# Patient Record
Sex: Male | Born: 1985 | Race: White | Hispanic: No | Marital: Married | Smoking: Former smoker
Health system: Southern US, Community
[De-identification: ages and names within clinical notes are randomized; demographics above are authoritative.]

## PROBLEM LIST (undated history)

## (undated) DIAGNOSIS — F329 Major depressive disorder, single episode, unspecified: Secondary | ICD-10-CM

## (undated) DIAGNOSIS — E785 Hyperlipidemia, unspecified: Secondary | ICD-10-CM

## (undated) DIAGNOSIS — F32A Depression, unspecified: Secondary | ICD-10-CM

## (undated) DIAGNOSIS — F419 Anxiety disorder, unspecified: Secondary | ICD-10-CM

## (undated) HISTORY — PX: KNEE SURGERY: SHX244

## (undated) HISTORY — PX: WISDOM TOOTH EXTRACTION: SHX21

## (undated) HISTORY — DX: Hyperlipidemia, unspecified: E78.5

## (undated) HISTORY — DX: Anxiety disorder, unspecified: F41.9

## (undated) HISTORY — DX: Depression, unspecified: F32.A

---
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
## 2010-01-21 ENCOUNTER — Emergency Department (HOSPITAL_COMMUNITY): Admission: EM | Admit: 2010-01-21 | Discharge: 2010-01-21 | Payer: Self-pay | Admitting: Emergency Medicine

## 2010-03-11 ENCOUNTER — Emergency Department (HOSPITAL_COMMUNITY): Admission: EM | Admit: 2010-03-11 | Discharge: 2010-03-11 | Payer: Self-pay | Admitting: Emergency Medicine

## 2010-08-01 LAB — RAPID STREP SCREEN (MED CTR MEBANE ONLY): Streptococcus, Group A Screen (Direct): NEGATIVE

## 2010-10-05 NOTE — Op Note (Signed)
NAME:  NORRIN, SHREFFLER                           ACCOUNT NO.:  [id number]   MEDICAL RECORD NO.:  [id number]                   PATIENT TYPE:  AMB   LOCATION:  DSC                                  FACILITY:  [facility]   PHYSICIAN:  Harvie Junior, M.D.                DATE OF BIRTH:  [birth year]   DATE OF PROCEDURE:  11/23/2003  DATE OF DISCHARGE:                                 OPERATIVE REPORT   PREOPERATIVE DIAGNOSES:  1. Anterior cruciate ligament tear.  2. Medial meniscal tear.  3. Lateral meniscal tear.   POSTOPERATIVE DIAGNOSES:  1. Anterior cruciate ligament tear.  2. Medial meniscal tear.  3. Lateral meniscal tear.   PROCEDURES:  1. Anterior cruciate ligament reconstruction with central one-third patella     tendon.  2. Repair of medial meniscus.  3. Partial debridement of lateral meniscal tear.   SURGEON:  Harvie Junior, M.D.   ASSISTANT:  Marshia Ly, P.A.   ANESTHESIA:  General.   BRIEF HISTORY:  A 25 year old male with a long history of having a twisting  injury to his knee; ultimately suffered ACL with medial and lateral meniscal  tears.  An MRI was obtained by Dr. Althea Charon, showing the pathology.  We were  consulted for treatment.  It showed the ACL tear and we talked about  treatment options and ultimately felt that fixation was most appropriate.  He is brought to the operating room for this procedure.   DESCRIPTION OF PROCEDURE:  The patient was brought to the operating room and  after adequate anesthesia was obtained with general endotracheal, the  patient was placed prone on the operating room table.  The left leg was  prepped and draped in the usual sterile fashion.  Following this, routine  arthroscopic examination of the knee revealed there was obvious anterior  cruciate ligament tear.  Examination under anesthesia had shown a positive  pivot shift, positive Lachman's'.   The attention was then turned to the medial side, where there was noted to  be an  undersurface medial meniscal tear.  There was a small area where it  came through on the superior surface and measured about 2 cm in length.  It  was felt at this time that the most appropriate course of action here was  going to be fixation.  They did not feel that suture fixation was warranted,  given that the displacement was not completely anterior to the condyle.  At  this time meniscal screws were chosen as a fixation option.  The meniscal  rasps were used to rasp the undersurface of the tear, as well as the small  less than 1 cm opening superiorly.  Four meniscal screws were placed in  succession, from the joint of the posterior third and anterior two-thirds  around to the posterior horn.  Excellent stability and fixation of the  meniscus was achieved.  The meniscus would  not translate forward at this  point.   Attention at this time turned to the lateral side, where actually this  procedure was performed before the medial meniscal repair; but, there was a  displaceable lateral meniscal tear.  This was debrided.  There was a complex  portion, but it was stable both above and below.  Given that we were going  to stabilize the knee it was felt that this was best left alone.  The  debridement of the anterior portion of this was undertaken from the lateral  portal.   Attention at this time was turned back to the medial side for the medial  meniscal repair (which had been outlined before) was performed.  Attention  at this time was started at the notch, where the notch was opened with a  combination of a suction shaver and motorized bur.  Excellent notchplasty  was performed.  Following this, the leg was exsanguinated and bulb pressure  tourniquet was inflated to 300 mmHg.  Following this a midline incision was  made from the patella distally.  Subcutaneous tissue was lifted out to the  level of the patella tendon.  Then 11 mm of central third patella tendon was  harvested, with bone  blocks from the patella as well as from the distal  tibia.  Following this, this was taken to the back table where it was  fashioned by Gus Puma graftologist.   Following this, attention was turned back to the knee, where the Arthrex  guide was used at a 55.  The footprint of the old ACL was used 7 mm anterior  to the posterior cruciate ligament.  A guide wire was placed into the  central portion here.  This was over-reamed 10.  The back wall was rasped.  A 6.5 mm over-the-top guide was then used, and the femoral hole was then  drilled with a footprint initially, to make sure there was a good back wall.  Seeing this, it was drilled to a level of 22, as that was the length of the  bone plug.  The overall tunnel length at this time was 87; the bone-tendon-  bone plug was 86 mm.   At this time, the notch was made for the femoral screw and all excess bone  and bone fragments were removed at this point.  The graft was then advanced  into the knee, locked in place on the femoral side with an 8 x 20 screw.  The knee was then cycled 20 times; no tendency towards an asymmetry.  The  tibial side was then locked in place with a 7 x 20 mm interference fit screw  under direct vision.   At this point, the camera was put back in place. The new ACL was watched.  Easy full extension was achieved and excellent stability throughout the  range of motion.  Medially and laterally were checked again.  No excess bone  fragments.  Excellent stability of the knee was achieved.  Negative  Lachman's, negative pivot shift.   The patella tendon was closed with three interrupted Ethibond sutures.  The  peritenon was closed with an 0 Vicryl running suture.  The subcutaneous was  closed with 2-0 Vicryl, and the skin with a 3-0 Maxon pullout suture.  A  sterile compressive dressing was applied at this point.  The portals were  closed with a bandage.  A knee immobilizer was placed, as well as a TED stocking.  The  patient was then  taken to the recovery room and noted to be  in satisfactory condition.   ESTIMATED BLOOD LOSS:  None.   TOTAL TOURNIQUET TIME:  35 min.                                               Harvie Junior, M.D.    Ranae Plumber  D:  11/23/2003  T:  11/24/2003  Job:  [id number]

## 2010-11-06 ENCOUNTER — Emergency Department (HOSPITAL_BASED_OUTPATIENT_CLINIC_OR_DEPARTMENT_OTHER)
Admission: EM | Admit: 2010-11-06 | Discharge: 2010-11-06 | Disposition: A | Discharge status: Home / Self Care | Payer: Self-pay | Attending: Emergency Medicine | Admitting: Emergency Medicine

## 2010-11-06 DIAGNOSIS — F341 Dysthymic disorder: Secondary | ICD-10-CM | POA: Insufficient documentation

## 2010-11-06 DIAGNOSIS — R197 Diarrhea, unspecified: Secondary | ICD-10-CM | POA: Insufficient documentation

## 2010-11-06 DIAGNOSIS — J029 Acute pharyngitis, unspecified: Secondary | ICD-10-CM | POA: Insufficient documentation

## 2010-12-29 ENCOUNTER — Encounter: Payer: Self-pay | Admitting: Emergency Medicine

## 2010-12-29 ENCOUNTER — Emergency Department (HOSPITAL_BASED_OUTPATIENT_CLINIC_OR_DEPARTMENT_OTHER)
Admission: EM | Admit: 2010-12-29 | Discharge: 2010-12-29 | Disposition: A | Discharge status: Home / Self Care | Payer: Self-pay | Attending: Emergency Medicine | Admitting: Emergency Medicine

## 2010-12-29 DIAGNOSIS — R112 Nausea with vomiting, unspecified: Secondary | ICD-10-CM | POA: Insufficient documentation

## 2010-12-29 DIAGNOSIS — N390 Urinary tract infection, site not specified: Secondary | ICD-10-CM | POA: Insufficient documentation

## 2010-12-29 DIAGNOSIS — J020 Streptococcal pharyngitis: Secondary | ICD-10-CM | POA: Insufficient documentation

## 2010-12-29 DIAGNOSIS — N509 Disorder of male genital organs, unspecified: Secondary | ICD-10-CM | POA: Insufficient documentation

## 2010-12-29 DIAGNOSIS — R509 Fever, unspecified: Secondary | ICD-10-CM | POA: Insufficient documentation

## 2010-12-29 LAB — URINE MICROSCOPIC-ADD ON

## 2010-12-29 LAB — URINALYSIS, ROUTINE W REFLEX MICROSCOPIC
Bilirubin Urine: NEGATIVE
Hgb urine dipstick: NEGATIVE
Ketones, ur: NEGATIVE mg/dL
Protein, ur: 100 mg/dL — AB
Urobilinogen, UA: 0.2 mg/dL (ref 0.0–1.0)

## 2010-12-29 MED ORDER — HYDROCODONE-ACETAMINOPHEN 5-325 MG PO TABS: 2.0000 | ORAL_TABLET | ORAL | Status: AC | PRN

## 2010-12-29 MED ORDER — HYDROMORPHONE HCL 2 MG/ML IJ SOLN
INTRAMUSCULAR | Status: AC
  Administered 2010-12-29: 2 mg via INTRAMUSCULAR
  Filled 2010-12-29: qty 1

## 2010-12-29 MED ORDER — AZITHROMYCIN 1 G PO PACK
PACK | ORAL | Status: AC
  Filled 2010-12-29: qty 1

## 2010-12-29 MED ORDER — CIPROFLOXACIN HCL 500 MG PO TABS: 500.0000 mg | ORAL_TABLET | Freq: Two times a day (BID) | ORAL | Status: DC

## 2010-12-29 MED ORDER — PENICILLIN G BENZATHINE 1200000 UNIT/2ML IM SUSP
1.2000 10*6.[IU] | Freq: Once | INTRAMUSCULAR | Status: AC
  Administered 2010-12-29: 1.2 10*6.[IU] via INTRAMUSCULAR
  Filled 2010-12-29 (×2): qty 2

## 2010-12-29 MED ORDER — SODIUM CHLORIDE 0.9 % IV BOLUS (SEPSIS): 1000.0000 mL | Freq: Once | INTRAVENOUS | Status: DC

## 2010-12-29 MED ORDER — DOXYCYCLINE HYCLATE 100 MG PO CAPS: 100.0000 mg | ORAL_CAPSULE | Freq: Two times a day (BID) | ORAL | Status: AC

## 2010-12-29 MED ORDER — KETOROLAC TROMETHAMINE 30 MG/ML IJ SOLN
30.0000 mg | Freq: Once | INTRAMUSCULAR | Status: AC
  Administered 2010-12-29: 30 mg via INTRAVENOUS
  Filled 2010-12-29: qty 1

## 2010-12-29 MED ORDER — AZITHROMYCIN 1 G PO PACK
1.0000 g | PACK | Freq: Once | ORAL | Status: AC
Start: 2010-12-29 — End: 2010-12-29
  Administered 2010-12-29: 1 g via ORAL

## 2010-12-29 MED ORDER — DEXTROSE 5 % IV SOLN
1.0000 g | INTRAVENOUS | Status: DC
  Administered 2010-12-29: 1 g via INTRAVENOUS
  Filled 2010-12-29: qty 1

## 2010-12-29 NOTE — ED Notes (Signed)
Patient denies pain and is resting comfortably.  

## 2010-12-29 NOTE — ED Provider Notes (Signed)
History     CSN: [id number] Arrival date & time: 12/29/2010 11:20 AM  Chief Complaint  Patient presents with  . Testicle Pain    Pt reports untreated testicualr pain and sore throat and weakness and congestion   Patient is a 25 y.o. male presenting with testicular pain and pharyngitis. The history is provided by the patient.  Testicle Pain The current episode started 1 to 4 weeks ago. The problem occurs constantly. The problem has been gradually worsening. Associated symptoms include chills, a fever, headaches, nausea, a sore throat, swollen glands and vomiting. The symptoms are aggravated by exertion. He has tried nothing for the symptoms.  Sore Throat This is a new problem. The current episode started in the past 7 days. The problem occurs constantly. The problem has been gradually worsening. Associated symptoms include chills, a fever, headaches, nausea, a sore throat, swollen glands and vomiting. The symptoms are aggravated by nothing. He has tried nothing for the symptoms.   patient reports he was seen in urgent care yesterday and given a prescription for an antibiotic. Patient reports the antibiotic cost over $200 and he could not afford to get it filled. Patient reports he was told that he had an infection to the back of his testicles. Patient reports today that. Throat is very painful and swollen.  History reviewed. No pertinent past medical history.  History reviewed. No pertinent past surgical history.  History reviewed. No pertinent family history.  History  Substance Use Topics  . Smoking status: Never Smoker   . Smokeless tobacco: Not on file  . Alcohol Use: No      Review of Systems  Constitutional: Positive for fever and chills.  HENT: Positive for sore throat.   Gastrointestinal: Positive for nausea and vomiting.  Genitourinary: Positive for urgency and testicular pain.  Musculoskeletal: Positive for back pain.  Neurological: Positive for headaches.  All other  systems reviewed and are negative.    Physical Exam  BP 117/88  Pulse 110  Temp(Src) 98.8 F (37.1 C) (Oral)  Resp 22  Wt 190 lb (86.183 kg)  Physical Exam  Nursing note and vitals reviewed. Constitutional: He is oriented to person, place, and time. He appears well-developed and well-nourished.  HENT:  Head: Normocephalic and atraumatic.  Right Ear: External ear normal.  Left Ear: External ear normal.  Nose: Nose normal.  Mouth/Throat: Oropharyngeal exudate present.       Bilateral tonsils, swollen exudative  Eyes: Conjunctivae and EOM are normal. Pupils are equal, round, and reactive to light.  Neck: Normal range of motion. Neck supple.  Cardiovascular: Normal rate.   Pulmonary/Chest: Effort normal and breath sounds normal.  Abdominal: Soft. Bowel sounds are normal.  Genitourinary: Penis normal.       No testicular swelling. Nontender to palpation, no evidence of torsion. Patient is circumcised. No penile discharge  Musculoskeletal: Normal range of motion.  Neurological: He is alert and oriented to person, place, and time.  Skin: Skin is warm and dry.  Psychiatric: He has a normal mood and affect.    ED Course  Procedures  MD Patient given IV normal saline x1 L. He is given IV Dilaudid and Zofran. Patient is given Rocephin 1 g IM urine returns and shows many bacteria. Strep screen is positive. Patient is given Bicillin LA IM. Patient is given a prescription for doxycycline #28 one by mouth twice a day and hydrocodone for pain. He is to return to the emergency department if symptoms worsen or change thank  you     Langston Masker, [place] 12/29/10 [phone]

## 2010-12-29 NOTE — ED Notes (Signed)
Patient is resting comfortably.benefits of Meds and follow up reviewed with pt and family Hydration reviewed pt verbalizes plan well

## 2010-12-29 NOTE — ED Notes (Signed)
Patient is resting comfortably.Pain controlled 

## 2010-12-29 NOTE — ED Notes (Signed)
Pt reports congestion and headache with epididymitis unable to afford antibiotic

## 2010-12-31 NOTE — ED Provider Notes (Signed)
Evaluation and management procedures were performed by the PA/NP under my supervision/collaboration.    Felisa Bonier, MD 12/31/10 [phone]

## 2013-06-30 ENCOUNTER — Emergency Department (INDEPENDENT_AMBULATORY_CARE_PROVIDER_SITE_OTHER)
Admission: EM | Admit: 2013-06-30 | Discharge: 2013-06-30 | Disposition: A | Discharge status: Home / Self Care | Payer: Self-pay | Source: Home / Self Care | Attending: Family Medicine | Admitting: Family Medicine

## 2013-06-30 DIAGNOSIS — B9789 Other viral agents as the cause of diseases classified elsewhere: Secondary | ICD-10-CM

## 2013-06-30 DIAGNOSIS — B349 Viral infection, unspecified: Secondary | ICD-10-CM

## 2013-06-30 MED ORDER — IPRATROPIUM BROMIDE 0.06 % NA SOLN: 2.0000 | Freq: Four times a day (QID) | NASAL | Status: DC

## 2013-06-30 MED ORDER — PROMETHAZINE HCL 25 MG PO TABS: 25.0000 mg | ORAL_TABLET | Freq: Four times a day (QID) | ORAL | Status: DC | PRN

## 2013-06-30 MED ORDER — TRAMADOL HCL 50 MG PO TABS: 50.0000 mg | ORAL_TABLET | Freq: Four times a day (QID) | ORAL | Status: DC | PRN

## 2013-06-30 NOTE — Discharge Instructions (Signed)
Thank you for coming in today. Use tramadol for cough as needed. Use Phenergan for vomiting as needed. Use Atrovent nasal spray. Take 2 Aleve twice daily for pain fevers chills or body aches. Call or go to the emergency room if you get worse, have trouble breathing, have chest pains, or palpitations.  If your belly pain worsens, or you have high fever, bad vomiting, blood in your stool or black tarry stool go to the Emergency Room.   Bronchitis Bronchitis is inflammation of the airways that extend from the windpipe into the lungs (bronchi). The inflammation often causes mucus to develop, which leads to a cough. If the inflammation becomes severe, it may cause shortness of breath. CAUSES  Bronchitis may be caused by:   Viral infections.   Bacteria.   Cigarette smoke.   Allergens, pollutants, and other irritants.  SIGNS AND SYMPTOMS  The most common symptom of bronchitis is a frequent cough that produces mucus. Other symptoms include:  Fever.   Body aches.   Chest congestion.   Chills.   Shortness of breath.   Sore throat.  DIAGNOSIS  Bronchitis is usually diagnosed through a medical history and physical exam. Tests, such as chest X-rays, are sometimes done to rule out other conditions.  TREATMENT  You may need to avoid contact with whatever caused the problem (smoking, for example). Medicines are sometimes needed. These may include:  Antibiotics. These may be prescribed if the condition is caused by bacteria.  Cough suppressants. These may be prescribed for relief of cough symptoms.   Inhaled medicines. These may be prescribed to help open your airways and make it easier for you to breathe.   Steroid medicines. These may be prescribed for those with recurrent (chronic) bronchitis. HOME CARE INSTRUCTIONS  Get plenty of rest.   Drink enough fluids to keep your urine clear or pale yellow (unless you have a medical condition that requires fluid restriction).  Increasing fluids may help thin your secretions and will prevent dehydration.   Only take over-the-counter or prescription medicines as directed by your health care provider.  Only take antibiotics as directed. Make sure you finish them even if you start to feel better.  Avoid secondhand smoke, irritating chemicals, and strong fumes. These will make bronchitis worse. If you are a smoker, quit smoking. Consider using nicotine gum or skin patches to help control withdrawal symptoms. Quitting smoking will help your lungs heal faster.   Put a cool-mist humidifier in your bedroom at night to moisten the air. This may help loosen mucus. Change the water in the humidifier daily. You can also run the hot water in your shower and sit in the bathroom with the door closed for 5 10 minutes.   Follow up with your health care provider as directed.   Wash your hands frequently to avoid catching bronchitis again or spreading an infection to others.  SEEK MEDICAL CARE IF: Your symptoms do not improve after 1 week of treatment.  SEEK IMMEDIATE MEDICAL CARE IF:  Your fever increases.  You have chills.   You have chest pain.   You have worsening shortness of breath.   You have bloody sputum.  You faint.  You have lightheadedness.  You have a severe headache.   You vomit repeatedly. MAKE SURE YOU:   Understand these instructions.  Will watch your condition.  Will get help right away if you are not doing well or get worse. Document Released: 05/06/2005 Document Revised: 02/24/2013 Document Reviewed: 12/29/2012 [employer] Patient  Information ©2014 [employer]. ° °

## 2013-06-30 NOTE — ED Provider Notes (Signed)
Levi Allen is a 28 y.o. male who presents to Urgent Care today for cough congestion nausea vomiting and diarrhea. The symptoms have been present for 3 days. Patient was exposed to his sick grandmother. No shortness of breath. He is eating and drinking normally. He's tried some over-the-counter medications which have helped some. He feels well otherwise. He has missed work. He works as a [employer].   No past medical history on file. History  Substance Use Topics  . Smoking status: Never Smoker   . Smokeless tobacco: Not on file  . Alcohol Use: No   ROS as above Medications: No current facility-administered medications for this encounter.   Current Outpatient Prescriptions  Medication Sig Dispense Refill  . ipratropium (ATROVENT) 0.06 % nasal spray Place 2 sprays into both nostrils 4 (four) times daily.  15 mL  1  . promethazine (PHENERGAN) 25 MG tablet Take 1 tablet (25 mg total) by mouth every 6 (six) hours as needed for nausea or vomiting.  30 tablet  0  . traMADol (ULTRAM) 50 MG tablet Take 1 tablet (50 mg total) by mouth every 6 (six) hours as needed (cough).  10 tablet  0    Exam:  Temperature 99.62F, heart rate 97 beats per minute, respiratory rate 18, pulse oxygen saturation 100%, blood pressure 127/90 Gen: Well NAD HEENT: EOMI,  MMM, posterior pharynx with cobblestoning. Tonsillar hypertrophy bilaterally. Tympanic membranes are normal appearing bilaterally Lungs: Normal work of breathing. CTABL Heart: RRR no MRG Abd: NABS, Soft. NT, ND Exts: Brisk capillary refill, warm and well perfused.   No results found for this or any previous visit (from the past 24 hour(s)). No results found.  Assessment and Plan: [age] y.o. male with viral illness including respiratory and gastroenteritis. Plan to treat with tramadol for cough, Atrovent nasal spray, and Phenergan. We'll use NSAIDs for fever control. Work note provided. Followup as needed.  Discussed warning signs or symptoms.  Please see discharge instructions. Patient expresses understanding.    Rodolph BongEvan S Ivanna Kocak, MD 06/30/13 [phone]

## 2013-10-27 ENCOUNTER — Emergency Department (HOSPITAL_COMMUNITY)
Admission: EM | Admit: 2013-10-27 | Discharge: 2013-10-27 | Disposition: A | Discharge status: Home / Self Care | Payer: BC Managed Care – PPO | Source: Home / Self Care | Attending: Family Medicine | Admitting: Family Medicine

## 2013-10-27 ENCOUNTER — Encounter (HOSPITAL_COMMUNITY): Payer: Self-pay | Admitting: Emergency Medicine

## 2013-10-27 DIAGNOSIS — J039 Acute tonsillitis, unspecified: Secondary | ICD-10-CM

## 2013-10-27 MED ORDER — PREDNISONE 10 MG PO TABS: 30.0000 mg | ORAL_TABLET | Freq: Every day | ORAL | Status: DC

## 2013-10-27 MED ORDER — AMOXICILLIN-POT CLAVULANATE 875-125 MG PO TABS: 1.0000 | ORAL_TABLET | Freq: Two times a day (BID) | ORAL | Status: DC

## 2013-10-27 MED ORDER — IPRATROPIUM BROMIDE 0.06 % NA SOLN: 2.0000 | Freq: Four times a day (QID) | NASAL | Status: DC

## 2013-10-27 NOTE — ED Provider Notes (Signed)
Levi Allen is a 28 y.o. male who presents to Urgent Care today for sore throat congestion ear congestion hoarse voice headache and fatigue. Symptoms present for about 2 weeks. No fevers chills nausea vomiting or diarrhea. No trouble breathing. Patient has been told that he has enlarged tonsils bilaterally. He snores loudly.   History reviewed. No pertinent past medical history. History  Substance Use Topics  . Smoking status: Never Smoker   . Smokeless tobacco: Not on file  . Alcohol Use: No   ROS as above Medications: No current facility-administered medications for this encounter.   Current Outpatient Prescriptions  Medication Sig Dispense Refill  . amoxicillin-clavulanate (AUGMENTIN) 875-125 MG per tablet Take 1 tablet by mouth every 12 (twelve) hours.  14 tablet  0  . ipratropium (ATROVENT) 0.06 % nasal spray Place 2 sprays into both nostrils 4 (four) times daily.  15 mL  1  . predniSONE (DELTASONE) 10 MG tablet Take 3 tablets (30 mg total) by mouth daily.  15 tablet  0  . [DISCONTINUED] promethazine (PHENERGAN) 25 MG tablet Take 1 tablet (25 mg total) by mouth every 6 (six) hours as needed for nausea or vomiting.  30 tablet  0    Exam:  BP 143/87  Pulse 77  Temp(Src) 99.3 F (37.4 C) (Oral)  Resp 16  SpO2 100% Gen: Well NAD HEENT: EOMI,  MMM significantly enlarged erythematous tonsils bilaterally with exudate. Lungs: Normal work of breathing. CTABL Heart: RRR no MRG Abd: NABS, Soft. NT, ND Exts: Brisk capillary refill, warm and well perfused.   Rapid strep negative: Culture pending  Assessment and Plan: 28 y.o. male with tonsillitis. Plan to treat with Augmentin and prednisone. Refer to ENT for consideration of tonsillectomy.  Discussed warning signs or symptoms. Please see discharge instructions. Patient expresses understanding.    Rodolph Bong, MD 10/27/13 2055

## 2013-10-27 NOTE — Discharge Instructions (Signed)
Thank you for coming in today. Please followup with [facility] ear nose and throat in the near future Tonsillitis Tonsillitis is an infection of the throat that causes the tonsils to become red, tender, and swollen. Tonsils are collections of lymphoid tissue at the back of the throat. Each tonsil has crevices (crypts). Tonsils help fight nose and throat infections and keep infection from spreading to other parts of the body for the first 18 months of life.  CAUSES Sudden (acute) tonsillitis is usually caused by infection with streptococcal bacteria. Long-lasting (chronic) tonsillitis occurs when the crypts of the tonsils become filled with pieces of food and bacteria, which makes it easy for the tonsils to become repeatedly infected. SYMPTOMS  Symptoms of tonsillitis include:  A sore throat, with possible difficulty swallowing.  White patches on the tonsils.  Fever.  Tiredness.  New episodes of snoring during sleep, when you did not snore before.  Small, foul-smelling, yellowish-white pieces of material (tonsilloliths) that you occasionally cough up or spit out. The tonsilloliths can also cause you to have bad breath. DIAGNOSIS Tonsillitis can be diagnosed through a physical exam. Diagnosis can be confirmed with the results of lab tests, including a throat culture. TREATMENT  The goals of tonsillitis treatment include the reduction of the severity and duration of symptoms and prevention of associated conditions. Symptoms of tonsillitis can be improved with the use of steroids to reduce the swelling. Tonsillitis caused by bacteria can be treated with antibiotics. Usually, treatment with antibiotics is started before the cause of the tonsillitis is known. However, if it is determined that the cause is not bacterial, antibiotics will not treat the tonsillitis. If attacks of tonsillitis are severe and frequent, your caregiver may recommend surgery to remove the tonsils (tonsillectomy). HOME CARE  INSTRUCTIONS   Rest as much as possible and get plenty of sleep.  Drink plenty of fluids. While the throat is very sore, eat soft foods or liquids, such as sherbet, soups, or instant breakfast drinks.  Eat frozen ice pops.  Gargle with a warm or cold liquid to help soothe the throat. Mix 1/4 teaspoon of salt and 1/4 teaspoon of baking soda in in 8 oz of water. SEEK MEDICAL CARE IF:   Large, tender lumps develop in your neck.  A rash develops.  A green, yellow-brown, or bloody substance is coughed up.  You are unable to swallow liquids or food for 24 hours.  You notice that only one of the tonsils is swollen. SEEK IMMEDIATE MEDICAL CARE IF:   You develop any new symptoms such as vomiting, severe headache, stiff neck, chest pain, or trouble breathing or swallowing.  You have severe throat pain along with drooling or voice changes.  You have severe pain, unrelieved with recommended medications.  You are unable to fully open the mouth.  You develop redness, swelling, or severe pain anywhere in the neck.  You have a fever. MAKE SURE YOU:   Understand these instructions.  Will watch your condition.  Will get help right away if you are not doing well or get worse. Document Released: 02/13/2005 Document Revised: 01/06/2013 Document Reviewed: 10/23/2012 [facility] Patient Information 2014 [place], [place].

## 2013-10-27 NOTE — ED Notes (Signed)
Patient complains of sore throat and headache for the past week Having some nasal congestion and clogged ears Feels weak and has a decreased appetite

## 2013-10-28 LAB — POCT RAPID STREP A: STREPTOCOCCUS, GROUP A SCREEN (DIRECT): NEGATIVE

## 2013-10-29 LAB — CULTURE, GROUP A STREP

## 2013-11-21 ENCOUNTER — Encounter (HOSPITAL_COMMUNITY): Payer: Self-pay | Admitting: Emergency Medicine

## 2013-11-21 ENCOUNTER — Emergency Department (HOSPITAL_COMMUNITY)
Admission: EM | Admit: 2013-11-21 | Discharge: 2013-11-21 | Disposition: A | Discharge status: Home / Self Care | Payer: BC Managed Care – PPO | Attending: Emergency Medicine | Admitting: Emergency Medicine

## 2013-11-21 DIAGNOSIS — Z791 Long term (current) use of non-steroidal anti-inflammatories (NSAID): Secondary | ICD-10-CM | POA: Insufficient documentation

## 2013-11-21 DIAGNOSIS — Z792 Long term (current) use of antibiotics: Secondary | ICD-10-CM | POA: Insufficient documentation

## 2013-11-21 DIAGNOSIS — R509 Fever, unspecified: Secondary | ICD-10-CM | POA: Insufficient documentation

## 2013-11-21 DIAGNOSIS — J029 Acute pharyngitis, unspecified: Secondary | ICD-10-CM | POA: Insufficient documentation

## 2013-11-21 LAB — RAPID STREP SCREEN (MED CTR MEBANE ONLY): STREPTOCOCCUS, GROUP A SCREEN (DIRECT): NEGATIVE

## 2013-11-21 NOTE — ED Provider Notes (Signed)
CSN: [id number]     Arrival date & time 11/21/13  1727 History   First MD Initiated Contact with Patient 11/21/13 1918     Chief Complaint  Patient presents with  . Sore Throat   28 y/o male with known enlarged tounsils presents with sore throat for 3 days, worse with swallowing, moderate pain. Patient has minor cough that is non-productive and noted to have fever at home. Patient is followed by ENT for enlarged tonsils. Patient denies difficulty with handling secretions and states he is able to swallow with out difficulty. Patient has tried OTC medication with minimal relief.    (Consider location/radiation/quality/duration/timing/severity/associated sxs/prior Treatment) HPI  History reviewed. No pertinent past medical history. History reviewed. No pertinent past surgical history. No family history on file. History  Substance Use Topics  . Smoking status: Never Smoker   . Smokeless tobacco: Not on file  . Alcohol Use: No    Review of Systems  Constitutional: Positive for fever. Negative for activity change.  HENT: Positive for sore throat. Negative for congestion.   Eyes: Negative for visual disturbance.  Respiratory: Negative for cough and shortness of breath.   Cardiovascular: Negative for chest pain and leg swelling.  Gastrointestinal: Negative for abdominal pain and blood in stool.  Genitourinary: Negative for dysuria and hematuria.  Musculoskeletal: Negative for back pain.  Skin: Negative for color change.  Neurological: Negative for syncope and headaches.  Psychiatric/Behavioral: Negative for agitation.      Allergies  Shellfish allergy  Home Medications   Prior to Admission medications   Medication Sig Start Date End Date Taking? Authorizing Provider  Acetaminophen (TYLENOL PO) Take 2 tablets by mouth once.   Yes Historical Provider, MD  ALPRAZolam Prudy Feeler(XANAX) 1 MG tablet Take 1 mg by mouth daily as needed for anxiety.   Yes Historical Provider, MD   amoxicillin-clavulanate (AUGMENTIN) 875-125 MG per tablet Take 1 tablet by mouth 2 (two) times daily.   Yes Historical Provider, MD  ibuprofen (ADVIL,MOTRIN) 200 MG tablet Take 400 mg by mouth once.    Yes Historical Provider, MD  Pseudoephedrine-APAP-DM (DAYQUIL PO) Take 30 mLs by mouth once.   Yes Historical Provider, MD   BP 128/78  Pulse 99  Temp(Src) 99.7 F (37.6 C) (Oral)  Resp 21  SpO2 98% Physical Exam  Nursing note and vitals reviewed. Constitutional: He is oriented to person, place, and time. He appears well-developed and well-nourished.  HENT:  Head: Normocephalic.  tonsils notable enlarged with no erythremia, or exudates   Eyes: Pupils are equal, round, and reactive to light.  Neck: Neck supple.  Cardiovascular: Normal rate and regular rhythm.  Exam reveals no gallop and no friction rub.   No murmur heard. Pulmonary/Chest: Effort normal. No respiratory distress.  Abdominal: Soft. He exhibits no distension. There is no tenderness.  Musculoskeletal: He exhibits no edema.  Neurological: He is alert and oriented to person, place, and time.  Skin: Skin is warm.  Psychiatric: He has a normal mood and affect.    ED Course  Procedures (including critical care time) Labs Review Labs Reviewed  RAPID STREP SCREEN  CULTURE, GROUP A STREP    Imaging Review No results found.   EKG Interpretation None      MDM   Final diagnoses:  Viral pharyngitis   28 y/o male with sore throat and enlarged tonsils that is followed by ENT. No problems with handeling secretions. Patient is strep negative and exam demonstrated no erythema or exudates. Likely viral pharyngitis, patient  discharged with instructions to continue symtpom care at home and to follow-up with ENT if symptoms worsen.     Clement SayresStaci Jaynee Winters, MD 11/22/13 [phone]

## 2013-11-21 NOTE — ED Notes (Signed)
The pt had tylenol approx one hour prior to arrival

## 2013-11-21 NOTE — ED Notes (Signed)
The pt is c/o a sore throat elevated temp.  Headache nausea diarrhea since Thursday.  He has a throat culture done Thursday at work but he decided to come here to be checked today because he felt so bad

## 2013-11-22 NOTE — ED Provider Notes (Signed)
I saw and evaluated the patient, reviewed the resident's note and I agree with the findings and plan.   EKG Interpretation None       Patient with likely viral pharyngitis. No trouble swallowing. D/C  Audree CamelScott T Keilan Nichol, MD 11/22/13 [phone]

## 2013-11-23 LAB — CULTURE, GROUP A STREP

## 2014-01-05 ENCOUNTER — Ambulatory Visit (HOSPITAL_BASED_OUTPATIENT_CLINIC_OR_DEPARTMENT_OTHER): Payer: BC Managed Care – PPO | Attending: Otolaryngology

## 2015-06-05 ENCOUNTER — Encounter (HOSPITAL_COMMUNITY): Payer: Self-pay | Admitting: *Deleted

## 2015-06-05 ENCOUNTER — Emergency Department (HOSPITAL_COMMUNITY): Payer: BLUE CROSS/BLUE SHIELD

## 2015-06-05 ENCOUNTER — Emergency Department (HOSPITAL_COMMUNITY)
Admission: EM | Admit: 2015-06-05 | Discharge: 2015-06-05 | Disposition: A | Discharge status: Home / Self Care | Payer: BLUE CROSS/BLUE SHIELD | Attending: Emergency Medicine | Admitting: Emergency Medicine

## 2015-06-05 DIAGNOSIS — J189 Pneumonia, unspecified organism: Secondary | ICD-10-CM

## 2015-06-05 DIAGNOSIS — R05 Cough: Secondary | ICD-10-CM | POA: Diagnosis present

## 2015-06-05 DIAGNOSIS — Z792 Long term (current) use of antibiotics: Secondary | ICD-10-CM | POA: Insufficient documentation

## 2015-06-05 DIAGNOSIS — J159 Unspecified bacterial pneumonia: Secondary | ICD-10-CM | POA: Diagnosis not present

## 2015-06-05 MED ORDER — HYDROCODONE-HOMATROPINE 5-1.5 MG/5ML PO SYRP: 5.0000 mL | ORAL_SOLUTION | Freq: Four times a day (QID) | ORAL | Status: AC | PRN

## 2015-06-05 NOTE — Discharge Instructions (Signed)
Please include non-caffeine fluids. Please wash hands frequently. Please use a mask until symptoms have resolved. Your chest x-ray reveals a left lung pneumonia. Please continue your Zithromax as ordered. Please use 600 mg of ibuprofen every 6 hours, or Tylenol every 4 hours over the next 3 days. Then use ibuprofen every 6 hours as needed. use Hycodan for cough. This medication may cause drowsiness, please use with caution. Please have your company doctor recheck you in one week concerning your pneumonia. Community-Acquired Pneumonia, Adult Pneumonia is an infection of the lungs. One type of pneumonia can happen while a person is in a hospital. A different type can happen when a person is not in a hospital (community-acquired pneumonia). It is easy for this kind to spread from person to person. It can spread to you if you breathe near an infected person who coughs or sneezes. Some symptoms include:  A dry cough.  A wet (productive) cough.  Fever.  Sweating.  Chest pain. HOME CARE  Take over-the-counter and prescription medicines only as told by your doctor.  Only take cough medicine if you are losing sleep.  If you were prescribed an antibiotic medicine, take it as told by your doctor. Do not stop taking the antibiotic even if you start to feel better.  Sleep with your head and neck raised (elevated). You can do this by putting a few pillows under your head, or you can sleep in a recliner.  Do not use tobacco products. These include cigarettes, chewing tobacco, and e-cigarettes. If you need help quitting, ask your doctor.  Drink enough water to keep your pee (urine) clear or pale yellow. A shot (vaccine) can help prevent pneumonia. Shots are often suggested for:  People older than 29 years of age.  People older than 29 years of age:  Who are having cancer treatment.  Who have long-term (chronic) lung disease.  Who have problems with their body's defense system (immune  system). You may also prevent pneumonia if you take these actions:  Get the flu (influenza) shot every year.  Go to the dentist as often as told.  Wash your hands often. If soap and water are not available, use hand sanitizer. GET HELP IF:  You have a fever.  You lose sleep because your cough medicine does not help. GET HELP RIGHT AWAY IF:  You are short of breath and it gets worse.  You have more chest pain.  Your sickness gets worse. This is very serious if:  You are an older adult.  Your body's defense system is weak.  You cough up blood.   This information is not intended to replace advice given to you by your health care provider. Make sure you discuss any questions you have with your health care provider.   Document Released: 10/23/2007 Document Revised: 01/25/2015 Document Reviewed: 08/31/2014 Elsevier Interactive Patient Education [employer]2016 Elsevier Inc.

## 2015-06-05 NOTE — ED Provider Notes (Signed)
CSN: [id number]     Arrival date & time 06/05/15  1652 History  By signing my name below, I, Emmanuella Mensah, attest that this documentation has been prepared under the direction and in the presence of Ivery Quale, PA-C. Electronically Signed: Angelene Giovanni, ED Scribe. 06/05/2015. 6:59 PM.    Chief Complaint  Patient presents with  . Cough  . Fever   Patient is a 30 y.o. male presenting with cough. The history is provided by the patient. No language interpreter was used.  Cough Cough characteristics:  Non-productive Severity:  Moderate Onset quality:  Gradual Duration:  1 day Timing:  Intermittent Progression:  Worsening Chronicity:  New Relieved by:  Nothing Worsened by:  Nothing tried Ineffective treatments:  None tried Associated symptoms: fever    HPI Comments: Levi Allen is a 30 y.o. male who presents to the Emergency Department complaining of gradually worsening non-productive cough onset yesterday. He reports associated fever PTA. He adds that the highest temperature was approx. 103.1. He denies any recent travels out of the country or any recent hospitalizations. Pt was seen by his work physician and received a steroid vaccine and Zithromax. He denies an allergy to any antibiotics.    History reviewed. No pertinent past medical history. Past Surgical History  Procedure Laterality Date  . Knee surgery    . Wisdom tooth extraction     No family history on file. Social History  Substance Use Topics  . Smoking status: Never Smoker   . Smokeless tobacco: None  . Alcohol Use: No    Review of Systems  Constitutional: Positive for fever.  Respiratory: Positive for cough.   All other systems reviewed and are negative.     Allergies  Shellfish allergy  Home Medications   Prior to Admission medications   Medication Sig Start Date End Date Taking? Authorizing Provider  Acetaminophen (TYLENOL PO) Take 2 tablets by mouth once.    Historical Provider, MD   ALPRAZolam Prudy Feeler) 1 MG tablet Take 1 mg by mouth daily as needed for anxiety.    Historical Provider, MD  amoxicillin-clavulanate (AUGMENTIN) 875-125 MG per tablet Take 1 tablet by mouth 2 (two) times daily.    Historical Provider, MD  ibuprofen (ADVIL,MOTRIN) 200 MG tablet Take 400 mg by mouth once.     Historical Provider, MD  Pseudoephedrine-APAP-DM (DAYQUIL PO) Take 30 mLs by mouth once.    Historical Provider, MD   BP 113/70 mmHg  Pulse 97  Temp(Src) 98.5 F (36.9 C) (Oral)  Resp 18  SpO2 97% Physical Exam  Constitutional: He is oriented to person, place, and time. He appears well-developed and well-nourished.  HENT:  Head: Normocephalic and atraumatic.  Uvula is swollen but midline Nasal congestion present  Neck:  No cervical lymphadenopathy   Cardiovascular: Normal rate.   Pulmonary/Chest: Effort normal.  Abdominal: He exhibits no distension.  Musculoskeletal: He exhibits no edema.  Cap refill less than 2 seconds No edema  Neurological: He is alert and oriented to person, place, and time.  Skin: Skin is warm and dry.  Psychiatric: He has a normal mood and affect.  Nursing note and vitals reviewed.   ED Course  Procedures (including critical care time) DIAGNOSTIC STUDIES: Oxygen Saturation is 97% on RA, normal by my interpretation.    COORDINATION OF CARE: 6:57 PM- Pt advised of plan for treatment and pt agrees. Advised to increase fluid intake and to alternate Ibuprofen and Tylenol for fever. Reassured that the work physician has given him  the appropriate medications. Pt will receive work note for 5 days.   Imaging Review Dg Chest 2 View  06/05/2015  CLINICAL DATA:  Fever and productive cough since yesterday, fever to 103.1 degrees maximum EXAM: CHEST  2 VIEW COMPARISON:  03/11/2010 FINDINGS: Normal heart size, mediastinal contours, and pulmonary vascularity. Focal infiltrate identified at the lateral LEFT lung base, on lateral view likely within the anterior basal  segment of LEFT lower lobe consistent with pneumonia. Remaining lungs clear. No pleural effusion or pneumothorax. Bones unremarkable. IMPRESSION: LEFT lower lobe pneumonia. Electronically Signed   By: Levi SouthwardMark  Allen M.D.   On: 06/05/2015 17:36   Ivery QualeHobson Anav Lammert, PA-C has personally reviewed and evaluated these images as part of his medical decision-making.   MDM  Exam and xray suggest CAP. Pulse ox 97% on room air. Pt speaks in complete sentences. Plans were discussed with the patient to treat, then the patient acknowledged he had seen a physician and was started on zithromax. He reports no xray was done and he wanted another opinion so he came to the ED. Pt advised to continue the zithromax. Use tylenol and ibuprofen for fever and aching. Rx for hycodan given for assistance with cough. Pt to follow up with his MD in 5 or 6 days for recheck of the pneumonia.   Final diagnoses:  None    *I have reviewed nursing notes, vital signs, and all appropriate lab and imaging results for this patient.  **I personally performed the services described in this documentation, which was scribed in my presence. The recorded information has been reviewed and is accurate.Ivery Quale*  Aayana Reinertsen, PA-C 06/09/15 1331  Eber HongBrian Miller, MD 06/10/15 Marlyne Beards0002

## 2015-06-05 NOTE — ED Notes (Signed)
Pt comes in with fever and cough starting yesterday. Pt denies any v/d. Per pt fever was 103.1 with pt taking aleve before coming to hospital. Temp now 101.2

## 2016-09-10 IMAGING — DX DG CHEST 2V
2 series · 2 of 2 positions shown · non-contrast
Comparison: 03/11/2010

CLINICAL DATA: Fever and productive cough since yesterday, fever to
103.1 degrees maximum

EXAM:
CHEST  2 VIEW

[chest pa]
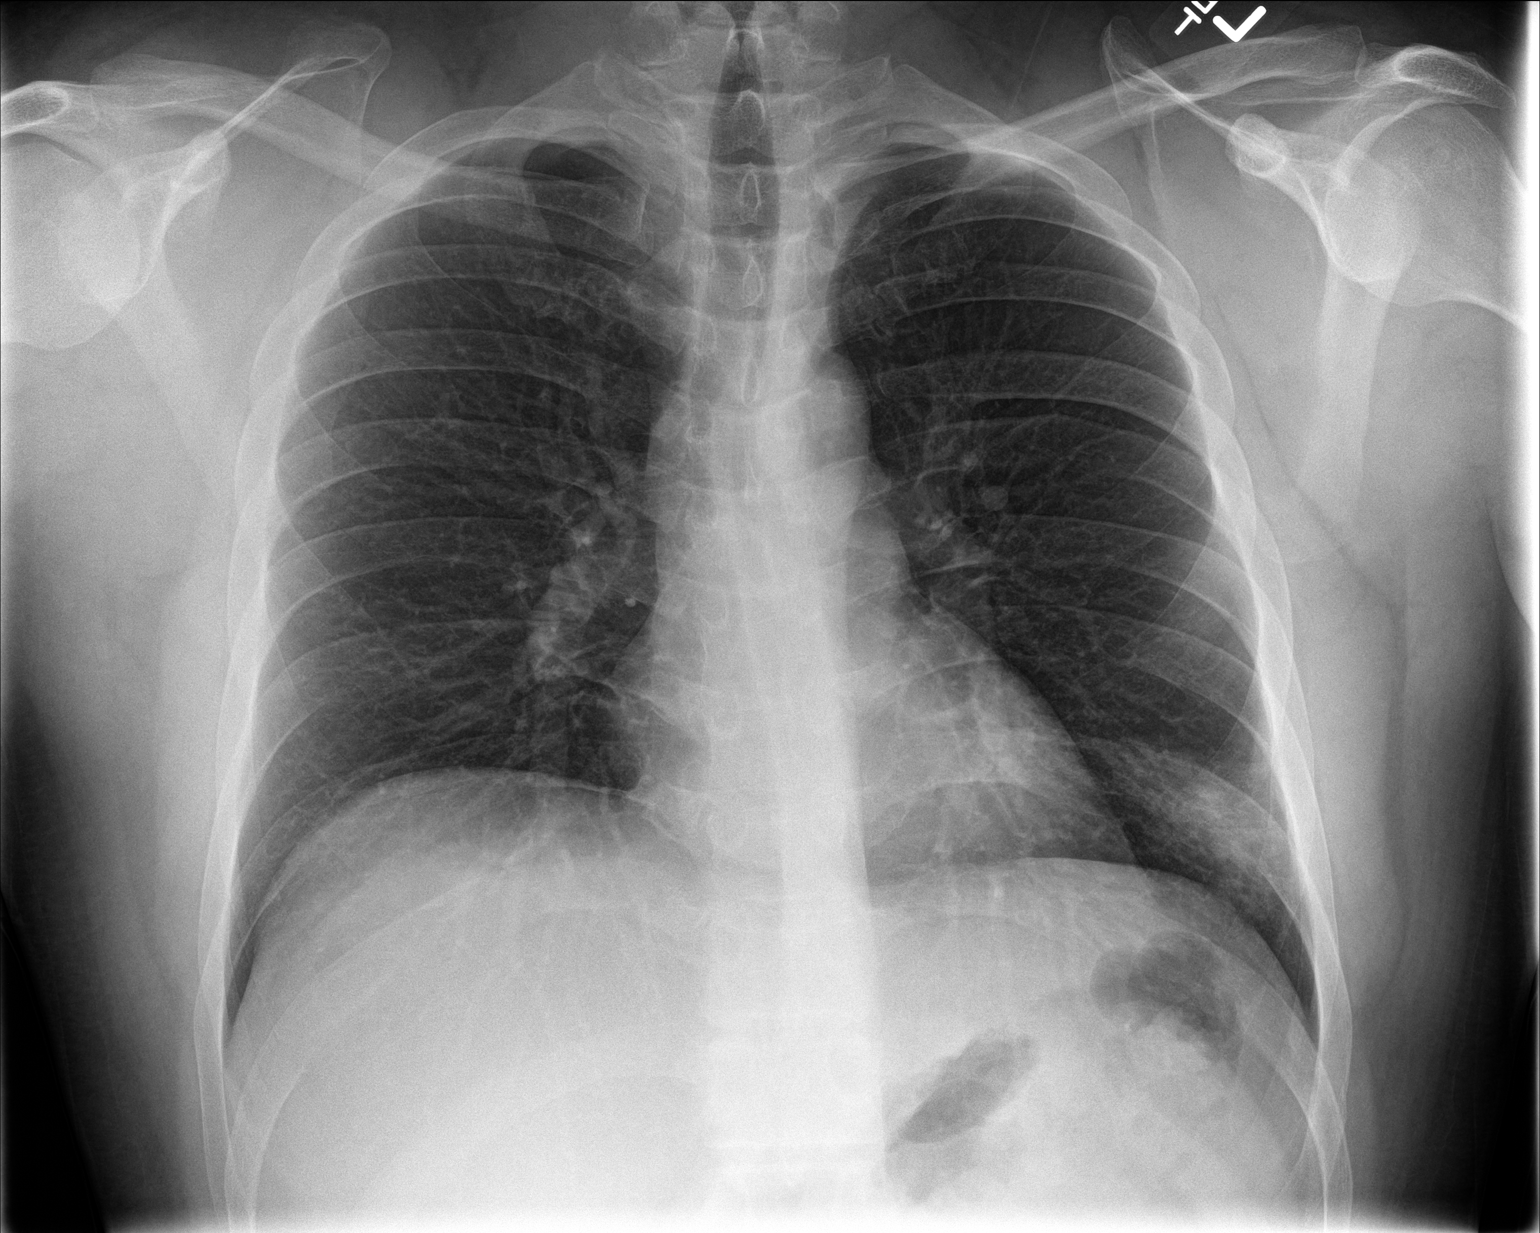

[chest lat]
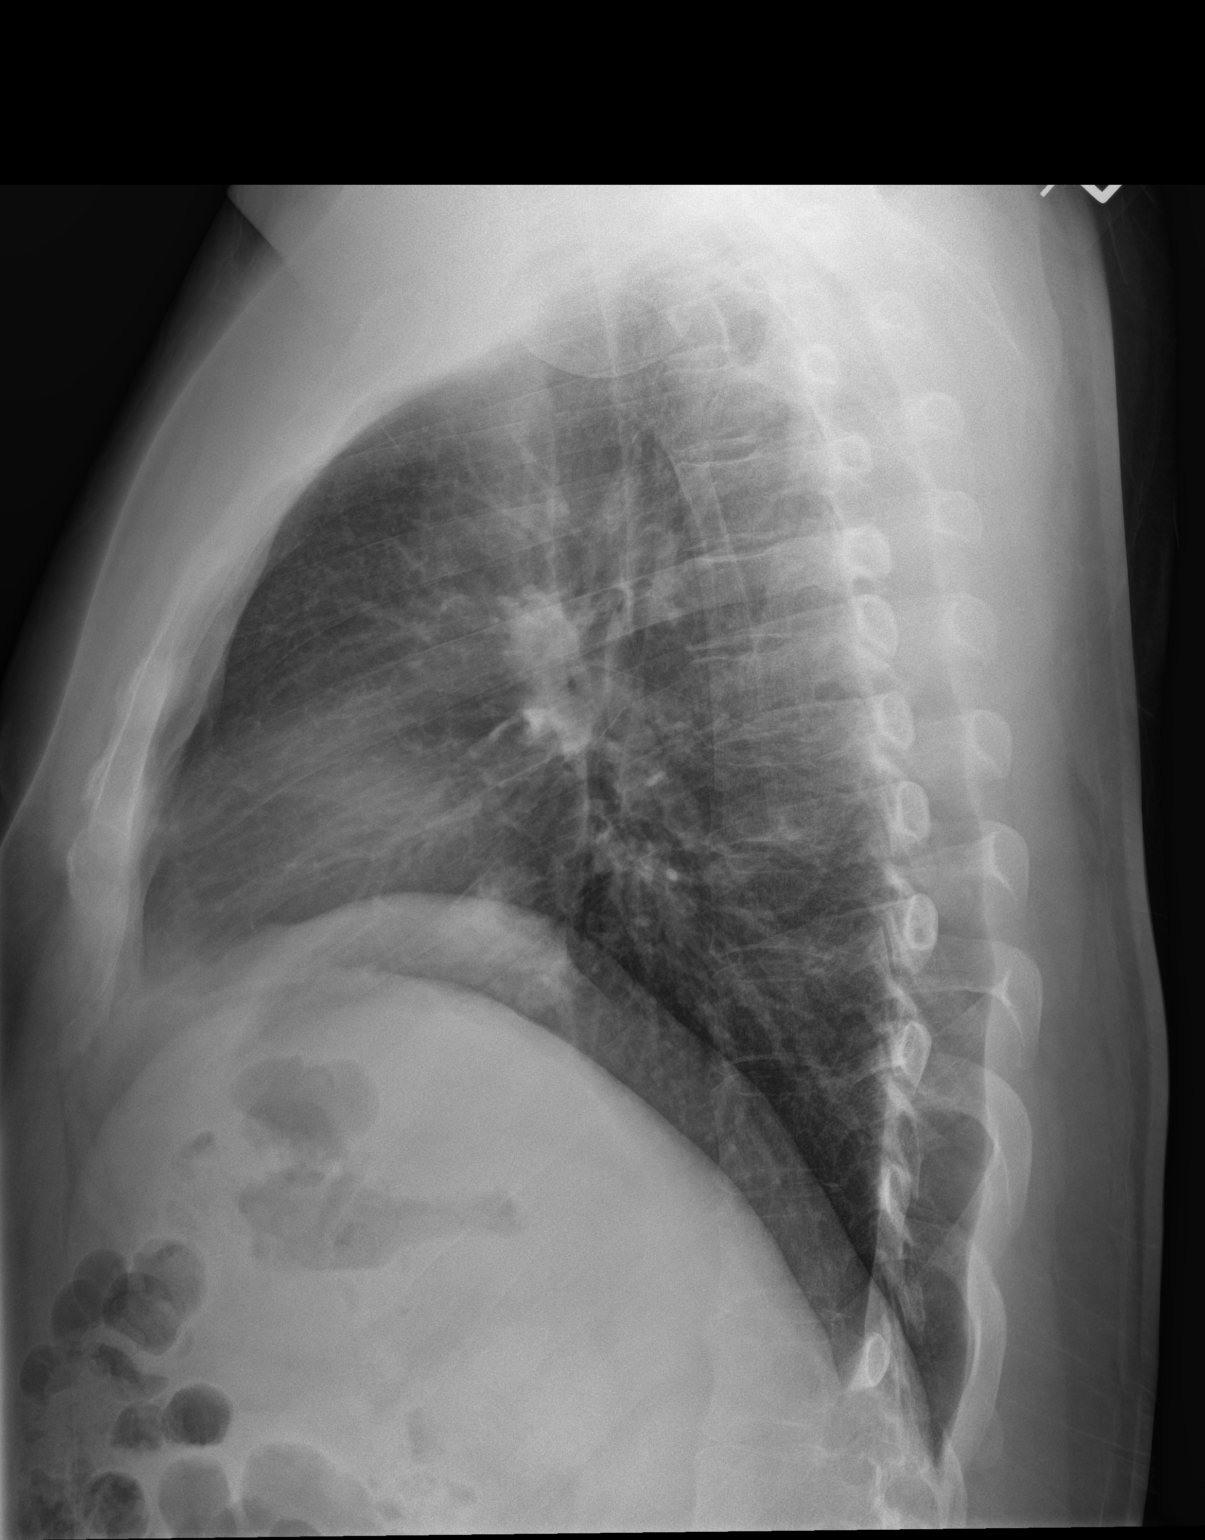

[2 of 2 positions shown; findings below may reference images not displayed]

FINDINGS: Normal heart size, mediastinal contours, and pulmonary vascularity.

Focal infiltrate identified at the lateral LEFT lung base, on
lateral view likely within the anterior basal segment of LEFT lower
lobe consistent with pneumonia.

Remaining lungs clear.

No pleural effusion or pneumothorax.

Bones unremarkable.
IMPRESSION: LEFT lower lobe pneumonia.

## 2019-04-23 DIAGNOSIS — R0683 Snoring: Secondary | ICD-10-CM | POA: Insufficient documentation

## 2019-04-23 DIAGNOSIS — J351 Hypertrophy of tonsils: Secondary | ICD-10-CM | POA: Insufficient documentation

## 2019-08-11 ENCOUNTER — Ambulatory Visit: Payer: Self-pay | Admitting: Cardiology

## 2019-08-19 NOTE — Progress Notes (Signed)
Patient referred by Geannie Risen, MD for hypertriglyceridemia  Subjective:   Levi Allen, male    DOB: April 05, 1986, 34 y.o.   MRN: [id number]   Chief Complaint  Patient presents with  . Hyperlipidemia  . New Patient (Initial Visit)    HPI  34 y.o. Caucasian male with hypertriglyceridemia, mixed hyperlipidemia.  Recent fasting lipid panel performed by PCP showed TG 441, HDL 28, LDL 80.   Patient works as an Network engineer.  He does not have any family history of coronary artery disease or hyperlipidemia. He denies chest pain, shortness of breath, palpitations, leg edema, orthopnea, PND, TIA/syncope.  His physical activity has significantly gone down for the past 1 year of pandemic, states he is stopped going to the gym's.  His diet is carbohydrate heavy, including rice, bread etc.  He is to have significant snoring at night, which is improved after losing several pounds over the last few years.   Past Medical History:  Diagnosis Date  . Anxiety   . Depression   . Hyperlipidemia      Past Surgical History:  Procedure Laterality Date  . KNEE SURGERY    . WISDOM TOOTH EXTRACTION       Social History   Tobacco Use  Smoking Status Never Smoker    Social History   Substance and Sexual Activity  Alcohol Use Yes   Comment: occ     Family History  Problem Relation Age of Onset  . Hypertension Brother      Current Outpatient Medications on File Prior to Visit  Medication Sig Dispense Refill  . Acetaminophen (TYLENOL PO) Take 2 tablets by mouth once.    . ALPRAZolam (XANAX) 1 MG tablet Take 1 mg by mouth daily as needed for anxiety.    Marland Kitchen HYDROcodone-homatropine (HYCODAN) 5-1.5 MG/5ML syrup Take 5 mLs by mouth every 6 (six) hours as needed. 120 mL 0  . ibuprofen (ADVIL,MOTRIN) 200 MG tablet Take 400 mg by mouth once.     Marland Kitchen QUEtiapine (SEROQUEL) 50 MG tablet Take 50 mg by mouth daily.    . sertraline (ZOLOFT) 100 MG tablet Take 1 tablet by mouth daily.      . [DISCONTINUED] promethazine (PHENERGAN) 25 MG tablet Take 1 tablet (25 mg total) by mouth every 6 (six) hours as needed for nausea or vomiting. 30 tablet 0   No current facility-administered medications on file prior to visit.    Cardiovascular and other pertinent studies:  EKG 08/23/2019: Sinus rhythm 65 bpm. Normal EKG.   Recent labs: 07/19/2019: Glucose 96, BUN/Cr 13/0.99. EGFR 100. Na/K 137/5.  ALT 60. Rest of the CMP normal.  Chol 179, TG 441, HDL 28, LDL 80 TSH 0.741 normal    Review of Systems  Cardiovascular: Negative for chest pain, dyspnea on exertion, leg swelling, palpitations and syncope.         Vitals:   08/23/19 0903  BP: 112/83  Pulse: 65  Resp: 18  Temp: 98 F (36.7 C)  SpO2: 97%     Body mass index is 29.24 kg/m. Filed Weights   08/23/19 0903  Weight: 198 lb (89.8 kg)     Objective:   Physical Exam  Constitutional: He appears well-developed and well-nourished.  Neck: No JVD present.  Cardiovascular: Normal rate, regular rhythm, normal heart sounds and intact distal pulses.  No murmur heard. Pulmonary/Chest: Effort normal and breath sounds normal. He has no wheezes. He has no rales.  Musculoskeletal:  General: No edema.  Nursing note and vitals reviewed.       Assessment & Recommendations:   34 y.o. Caucasian male with hypertriglyceridemia, mixed hyperlipidemia.  High triglyceride 441, who HDL 28.  He does not have any known atherosclerotic cardiovascular disease, or family history of such.  Do not recommend starting any medications at this time.  Discussed diet and lifestyle changes, including reducing carbohydrate intake, regular exercise and physical activity, 10,000 steps daily.  We will repeat fasting lipid panel in 3 months.  If triglyceride remains elevated, will consider pharmacological therapy.    Thank you for referring the patient to [place]. Please feel free to contact with any questions.  Nigel Mormon,  MD [facility] Pager: [phone] Office: [phone]

## 2019-08-23 ENCOUNTER — Ambulatory Visit: Payer: BC Managed Care – PPO | Admitting: Cardiology

## 2019-08-23 ENCOUNTER — Encounter: Payer: Self-pay | Admitting: Cardiology

## 2019-08-23 ENCOUNTER — Other Ambulatory Visit: Payer: Self-pay

## 2019-08-23 VITALS — BP 112/83 | HR 65 | Temp 98.0°F | Resp 18 | Ht 69.0 in | Wt 198.0 lb

## 2019-08-23 DIAGNOSIS — E782 Mixed hyperlipidemia: Secondary | ICD-10-CM | POA: Insufficient documentation

## 2019-08-23 DIAGNOSIS — E781 Pure hyperglyceridemia: Secondary | ICD-10-CM | POA: Insufficient documentation

## 2019-12-03 ENCOUNTER — Ambulatory Visit: Payer: BC Managed Care – PPO | Admitting: Cardiology

## 2019-12-27 ENCOUNTER — Ambulatory Visit: Payer: BC Managed Care – PPO | Admitting: Cardiology

## 2019-12-27 NOTE — Progress Notes (Deleted)
Patient referred by Geannie Risen, MD for hypertriglyceridemia  Subjective:   Levi Allen, male    DOB: January 28, 1986, 34 y.o.   MRN: [id number]   No chief complaint on file.   HPI  34 y.o. Caucasian male with hypertriglyceridemia, mixed hyperlipidemia.  Recent fasting lipid panel performed by PCP showed TG 441, HDL 28, LDL 80.   Patient works as an Network engineer.  He does not have any family history of coronary artery disease or hyperlipidemia. He denies chest pain, shortness of breath, palpitations, leg edema, orthopnea, PND, TIA/syncope.  His physical activity has significantly gone down for the past 1 year of pandemic, states he is stopped going to the gym's.  His diet is carbohydrate heavy, including rice, bread etc.  He is to have significant snoring at night, which is improved after losing several pounds over the last few years.   Past Medical History:  Diagnosis Date  . Anxiety   . Depression   . Hyperlipidemia      Past Surgical History:  Procedure Laterality Date  . KNEE SURGERY    . WISDOM TOOTH EXTRACTION       Social History   Tobacco Use  Smoking Status Former Smoker  . Packs/day: 0.50  . Years: 0.50  . Pack years: 0.25  . Types: Cigarettes  . Quit date: 2010  . Years since quitting: 11.6  Smokeless Tobacco Former Systems developer  . Types: Snuff  . Quit date: 2010    Social History   Substance and Sexual Activity  Alcohol Use Yes   Comment: occ     Family History  Problem Relation Age of Onset  . Hypertension Brother      Current Outpatient Medications on File Prior to Visit  Medication Sig Dispense Refill  . Acetaminophen (TYLENOL PO) Take 2 tablets by mouth once.    . ALPRAZolam (XANAX) 1 MG tablet Take 1 mg by mouth daily as needed for anxiety.    Marland Kitchen escitalopram (LEXAPRO) 10 MG tablet Take 10 mg by mouth daily.    Marland Kitchen HYDROcodone-homatropine (HYCODAN) 5-1.5 MG/5ML syrup Take 5 mLs by mouth every 6 (six) hours as needed. 120 mL 0  .  ibuprofen (ADVIL,MOTRIN) 200 MG tablet Take 400 mg by mouth once.     . Omega-3 Fatty Acids (FISH OIL) 1000 MG CPDR Take 1 tablet by mouth in the morning and at bedtime.    Marland Kitchen QUEtiapine (SEROQUEL) 50 MG tablet Take 50 mg by mouth daily.    . sertraline (ZOLOFT) 100 MG tablet Take 1 tablet by mouth daily.    . [DISCONTINUED] promethazine (PHENERGAN) 25 MG tablet Take 1 tablet (25 mg total) by mouth every 6 (six) hours as needed for nausea or vomiting. 30 tablet 0   No current facility-administered medications on file prior to visit.    Cardiovascular and other pertinent studies:  EKG 08/23/2019: Sinus rhythm 65 bpm. Normal EKG.   Recent labs: 07/19/2019: Glucose 96, BUN/Cr 13/0.99. EGFR 100. Na/K 137/5.  ALT 60. Rest of the CMP normal.  Chol 179, TG 441, HDL 28, LDL 80 TSH 0.741 normal    Review of Systems  Cardiovascular: Negative for chest pain, dyspnea on exertion, leg swelling, palpitations and syncope.         There were no vitals filed for this visit.   There is no height or weight on file to calculate BMI. There were no vitals filed for this visit.   Objective:   Physical Exam Vitals and  nursing note reviewed.  Constitutional:      Appearance: He is well-developed.  Neck:     Vascular: No JVD.  Cardiovascular:     Rate and Rhythm: Normal rate and regular rhythm.     Pulses: Intact distal pulses.     Heart sounds: Normal heart sounds. No murmur heard.   Pulmonary:     Effort: Pulmonary effort is normal.     Breath sounds: Normal breath sounds. No wheezing or rales.         Assessment & Recommendations:   34 y.o. Caucasian male with hypertriglyceridemia, mixed hyperlipidemia.  High triglyceride 441, low HDL 28.  He does not have any known atherosclerotic cardiovascular disease, or family history of such.  Do not recommend starting any medications at this time.  Discussed diet and lifestyle changes, including reducing carbohydrate intake, regular  exercise and physical activity, 10,000 steps daily.  We will repeat fasting lipid panel in 3 months.  If triglyceride remains elevated, will consider pharmacological therapy.    Thank you for referring the patient to [place]. Please feel free to contact with any questions.  Nigel Mormon, MD [facility] Pager: [phone] Office: [phone]

## 2020-04-04 ENCOUNTER — Other Ambulatory Visit: Payer: BLUE CROSS/BLUE SHIELD

## 2020-04-05 ENCOUNTER — Telehealth (HOSPITAL_COMMUNITY): Payer: Self-pay | Admitting: *Deleted

## 2020-04-05 NOTE — Telephone Encounter (Signed)
Called to Discuss with patient about Covid symptoms and the use of the monoclonal antibody infusion for those with mild to moderate Covid symptoms and at a high risk of hospitalization.     Pt appears to qualify for this infusion due to co-morbid conditions and/or a member of an at-risk group in accordance with the FDA Emergency Use Authorization.    Pt stated symptoms started 11/15; tested positive 11/17 at [facility]. Symptoms include cough, chills, and loss of taste and smell. Qualifying risk factor includes BMI>25. Pt stated height is 5'9" and weight is 200lbs.   Pt informed an APP will be following up to verify information and if he qualifies will schedule an appointment.   Estimated costs discussed with pt and he will call to see if insurance will cover treatment.

## 2020-04-06 ENCOUNTER — Telehealth: Payer: Self-pay | Admitting: Physician Assistant

## 2020-04-06 NOTE — Telephone Encounter (Signed)
Called to Discuss with patient about Covid symptoms and the use of the monoclonal antibody infusion for those with mild to moderate Covid symptoms and at a high risk of hospitalization.     Pt appears to qualify for this infusion due to co-morbid conditions and/or a member of an at-risk group in accordance with the FDA Emergency Use Authorization.   The patient is going to check with insurance company based on notes.    Unable to reach pt. Left voice mail to call back.
